# Patient Record
Sex: Male | Born: 1975 | Race: Black or African American | Hispanic: No | Marital: Single | Smoking: Never smoker
Health system: Southern US, Community
[De-identification: ages and names within clinical notes are randomized; demographics above are authoritative.]

## PROBLEM LIST (undated history)

## (undated) DIAGNOSIS — I10 Essential (primary) hypertension: Secondary | ICD-10-CM

## (undated) HISTORY — DX: Essential (primary) hypertension: I10

---

## 2008-08-17 ENCOUNTER — Emergency Department (HOSPITAL_COMMUNITY): Admission: EM | Admit: 2008-08-17 | Discharge: 2008-08-17 | Payer: Self-pay | Admitting: Emergency Medicine

## 2008-12-09 ENCOUNTER — Emergency Department (HOSPITAL_COMMUNITY): Admission: EM | Admit: 2008-12-09 | Discharge: 2008-12-09 | Payer: Self-pay | Admitting: Emergency Medicine

## 2011-01-14 LAB — BASIC METABOLIC PANEL
BUN: 9 mg/dL (ref 6–23)
CO2: 25 mEq/L (ref 19–32)
Calcium: 9.2 mg/dL (ref 8.4–10.5)
Creatinine, Ser: 1.15 mg/dL (ref 0.4–1.5)
Glucose, Bld: 95 mg/dL (ref 70–99)

## 2011-01-14 LAB — DIFFERENTIAL
Basophils Absolute: 0 10*3/uL (ref 0.0–0.1)
Eosinophils Absolute: 0.2 10*3/uL (ref 0.0–0.7)
Eosinophils Relative: 3 % (ref 0–5)
Lymphocytes Relative: 31 % (ref 12–46)
Monocytes Absolute: 0.5 10*3/uL (ref 0.1–1.0)

## 2011-01-14 LAB — CBC
Hemoglobin: 15.6 g/dL (ref 13.0–17.0)
MCHC: 34 g/dL (ref 30.0–36.0)
Platelets: 221 10*3/uL (ref 150–400)
RDW: 13.2 % (ref 11.5–15.5)

## 2011-01-14 LAB — CK TOTAL AND CKMB (NOT AT ARMC): CK, MB: 4.7 ng/mL — ABNORMAL HIGH (ref 0.3–4.0)

## 2011-01-14 LAB — POCT CARDIAC MARKERS
CKMB, poc: 3.5 ng/mL (ref 1.0–8.0)
Myoglobin, poc: 127 ng/mL (ref 12–200)

## 2011-02-16 NOTE — Consult Note (Signed)
NAMEKORE, MADLOCK NO.:  [id number]   MEDICAL RECORD NO.:  [id number]          PATIENT TYPE:  EMS   LOCATION:  [facility]                         FACILITY:  [facility]   PHYSICIAN:  Georga Hacking, M.D.DATE OF BIRTH:  Jul 24, 1976   DATE OF CONSULTATION:  12/09/2008  DATE OF DISCHARGE:                                 CONSULTATION   I was asked to see this patient by the emergency room physician for  evaluation of an abnormal EKG and atypical chest pain.  The patient has  had an upper respiratory infection for several weeks and has noted some  shortness of breath.  He turned over in bed last night and had some left  arm pain and right arm pain as well as some midupper back pain that was  vague.  His girlfriend is a nurse told to me all to be checked and he  went to the [facility] and was then sent to the emergency  room because of the complaints of chest pain.  An EKG initially done on  him was normal.  However, a repeat EKG done in the [facility] Emergency  Room showed right axis deviation, inferolateral T-wave inversions.  The  ER doctor started him on heparin and asked me to see the patient.  The  patient is not actively having midsternal chest pain.  There is no  recent history of untreated high blood pressure, cocaine use.  He does  admit occasionally smoking cigarettes.  He has not had any exertional  chest pressure, tightness suggestive of angina.   PHYSICAL EXAMINATION:  GENERAL:  He is a slightly obese male who is  currently in no acute distress.  VITAL SIGNS:  Blood pressure was 130/80, pulse was 88.  SKIN:  Warm and dry.  LUNGS:  Clear.  CARDIOVASCULAR:  Normal S1 and S2.  No S3.  ABDOMEN:  Soft and nontender.  EXTREMITIES:  Distal pulses present are 2+.   I repeated the EKG on him and it is unchanged from the previous one.   IMPRESSION:  1. Atypical chest pain, doubt ischemic chest pain.  2. Shortness of breath, upper respiratory  symptoms.  The only thing      that I neglected to mention of the upper history is the fact that      he took a prolonged airplane rod several days ago.  I feel at this      point that he probably needs to have an evaluation to rule out      exclude pulmonary embolus.   Our recommendations at this point would be to check serial cardiac  enzymes, a D-dimer, a BNP level, a chest x-ray and I would be happy to  see him if anything surfaces aside of the above.   My impression is that the EKG done in the interval probably represented  placement of the leads in the wrong place as the subsequent EKG was  unchanged.      Georga Hacking, M.D.  Electronically Signed     WST/MEDQ  D:  12/09/2008  T:  12/10/2008  Job:  [id number]   cc:   Emergency room physician

## 2011-10-26 ENCOUNTER — Emergency Department (HOSPITAL_COMMUNITY)
Admission: EM | Admit: 2011-10-26 | Discharge: 2011-10-26 | Disposition: A | Discharge status: Home / Self Care | Payer: Self-pay | Attending: Emergency Medicine | Admitting: Emergency Medicine

## 2011-10-26 ENCOUNTER — Emergency Department (HOSPITAL_COMMUNITY): Payer: Self-pay

## 2011-10-26 ENCOUNTER — Encounter (HOSPITAL_COMMUNITY): Payer: Self-pay | Admitting: General Practice

## 2011-10-26 DIAGNOSIS — S42293A Other displaced fracture of upper end of unspecified humerus, initial encounter for closed fracture: Secondary | ICD-10-CM

## 2011-10-26 DIAGNOSIS — W1809XA Striking against other object with subsequent fall, initial encounter: Secondary | ICD-10-CM | POA: Insufficient documentation

## 2011-10-26 DIAGNOSIS — S43016A Anterior dislocation of unspecified humerus, initial encounter: Secondary | ICD-10-CM | POA: Insufficient documentation

## 2011-10-26 DIAGNOSIS — Y998 Other external cause status: Secondary | ICD-10-CM | POA: Insufficient documentation

## 2011-10-26 DIAGNOSIS — Y92009 Unspecified place in unspecified non-institutional (private) residence as the place of occurrence of the external cause: Secondary | ICD-10-CM | POA: Insufficient documentation

## 2011-10-26 DIAGNOSIS — F17200 Nicotine dependence, unspecified, uncomplicated: Secondary | ICD-10-CM | POA: Insufficient documentation

## 2011-10-26 MED ORDER — SODIUM CHLORIDE 0.9 % IV SOLN
INTRAVENOUS | Status: AC | PRN
  Administered 2011-10-26: 50 mL/h via INTRAVENOUS

## 2011-10-26 MED ORDER — PROPOFOL 10 MG/ML IV BOLUS
INTRAVENOUS | Status: AC | PRN
  Administered 2011-10-26: 206 mg via INTRAVENOUS

## 2011-10-26 MED ORDER — PROPOFOL 10 MG/ML IV BOLUS
INTRAVENOUS | Status: AC | PRN
  Administered 2011-10-26: 136 mg via INTRAVENOUS

## 2011-10-26 MED ORDER — OXYCODONE-ACETAMINOPHEN 5-325 MG PO TABS
1.0000 | ORAL_TABLET | Freq: Four times a day (QID) | ORAL | Status: AC | PRN
Start: 2011-10-26 — End: 2011-11-05

## 2011-10-26 MED ORDER — PROPOFOL 10 MG/ML IV EMUL
INTRAVENOUS | Status: AC
  Filled 2011-10-26: qty 40

## 2011-10-26 MED ORDER — MORPHINE SULFATE 4 MG/ML IJ SOLN
4.0000 mg | Freq: Once | INTRAMUSCULAR | Status: AC
  Administered 2011-10-26: 4 mg via INTRAVENOUS
  Filled 2011-10-26: qty 1

## 2011-10-26 MED ORDER — PROPOFOL 10 MG/ML IV EMUL
INTRAVENOUS | Status: AC
  Filled 2011-10-26: qty 20

## 2011-10-26 NOTE — ED Notes (Signed)
Pt sts no pain after second reduction. Awaiting post reduction films.

## 2011-10-26 NOTE — ED Provider Notes (Signed)
History     CSN: [id number]  Arrival date & time 10/26/11  0865   First MD Initiated Contact with Patient 10/26/11 0805      Chief Complaint  Patient presents with  . Fall  . Shoulder Pain    (Consider location/radiation/quality/duration/timing/severity/associated sxs/prior treatment) HPI Comments: Patient reports that just prior to arrival he tripped over a pair of shoes and his left shoulder hit the sink.  He is now having pain in his left shoulder and has noticed a deformity.  No prior shoulder dislocation or injury.  He did not hit his head with the injury.  No LOC.  Patient is a 36 y.o. male presenting with fall and shoulder pain. The history is provided by the patient.  Fall Pertinent negatives include no fever, no nausea, no vomiting, no headaches, no loss of consciousness and no tingling. He has tried nothing for the symptoms.  Shoulder Pain Pertinent negatives include no chest pain, chills, diaphoresis, fever, headaches, nausea or vomiting.    History reviewed. No pertinent past medical history.  History reviewed. No pertinent past surgical history.  No family history on file.  History  Substance Use Topics  . Smoking status: Current Everyday Smoker  . Smokeless tobacco: Not on file  . Alcohol Use: Yes     Socially      Review of Systems  Constitutional: Negative for fever, chills and diaphoresis.  Respiratory: Negative for shortness of breath.   Cardiovascular: Negative for chest pain.  Gastrointestinal: Negative for nausea and vomiting.  Skin: Negative for color change.  Neurological: Negative for dizziness, tingling, loss of consciousness, syncope and headaches.    Allergies  Review of patient's allergies indicates not on file.  Home Medications  No current outpatient prescriptions on file.  BP 144/82  Pulse 66  Temp(Src) 97.7 F (36.5 C) (Oral)  Resp 16  SpO2 99%  Physical Exam  Nursing note and vitals reviewed. Constitutional: He is  oriented to person, place, and time. He appears well-developed and well-nourished. No distress.  HENT:  Head: Normocephalic and atraumatic.  Neck: Normal range of motion. Neck supple.  Cardiovascular: Normal rate and normal heart sounds.   Pulmonary/Chest: Effort normal and breath sounds normal. No respiratory distress. He has no wheezes.  Musculoskeletal:       Left shoulder: He exhibits decreased range of motion and pain. He exhibits no swelling, no effusion, no laceration and normal pulse.       Positive sulcus sign of left shoulder. Left radial pulse 2+.  Distal sensation of the left hand intact.   Sensation of shoulder and humerus intact.  Neurological: He is alert and oriented to person, place, and time.  Skin: Skin is warm and dry. He is not diaphoretic. No erythema.  Psychiatric: He has a normal mood and affect.    ED Course  Reduction of dislocation Performed by: Anne Shutter, Chamia Schmutz Authorized by: Anne Shutter, Herbert Seta Consent: Verbal consent obtained. Written consent obtained. Risks and benefits: risks, benefits and alternatives were discussed Consent given by: patient Patient understanding: patient states understanding of the procedure being performed Patient consent: the patient's understanding of the procedure matches consent given Procedure consent: procedure consent matches procedure scheduled Relevant documents: relevant documents present and verified Test results: test results available and properly labeled Imaging studies: imaging studies available Patient identity confirmed: verbally with patient and arm band Time out: Immediately prior to procedure a "time out" was called to verify the correct patient, procedure, equipment, support staff and site/side  marked as required. Patient sedated: yes Sedation type: moderate (conscious) sedation Sedatives: propofol Vitals: Vital signs were monitored during sedation. Patient tolerance: Patient tolerated the procedure well  with no immediate complications.   (including critical care time)  Labs Reviewed - No data to display Dg Shoulder Left  10/26/2011  *RADIOLOGY REPORT*  Clinical Data: Fall.  Shoulder pain.  LEFT SHOULDER - 2+ VIEW  Comparison: None.  Findings: The left humerus is dislocated anteriorly and inferiorly with respect to the glenoid.  A discrete fracture is not readily apparent.  IMPRESSION:  1.  Anterior shoulder dislocation.  Original Report Authenticated By: Dellia Cloud, M.D.     No diagnosis found.  Shoulder dislocation was reduced after first reduction.  However, patient was moving his shoulder a lot prior to immobilizer application.  An xray showed that the shoulder was still dislocated.  10:45 AM  Shoulder dislocation reduction repeated.  Shoulder immobilizer immediately applied.  Another xray has been ordered to ensure shoulder is no longer dislocated.  Xray shows that the xray shoulder dislocation has been reduced, but xray did show Hill Sachs impaction of the humeral head and probably bony Bankart injury.  Patient neurovascularly intact.  Patient awake and alert.  Results of the Xray discussed with patient.  Patient demonstrates understanding.  Patient instructed to follow up with Orthopedics.     MDM  Patient with a mechanical fall resulting in an anterior shoulder dislocation.  Shoulder dislocation reduction done in the ED under sedation.  [facility] impaction of the radial head and probably bony Bankart injury were shown on repeat xray.  Patient instructed to follow up with Orthopedics, wear the immobilizer, and discharged home with pain medication.        Pascal Lux Tyler, PA-C 10/26/11 1600

## 2011-10-26 NOTE — ED Notes (Signed)
Shoulder found to be out of place a second time. Moderate sedation set up for second reduction. Hyman Hopes, MD, Herbert Seta, Georgia and 2 RNs at bedside.

## 2011-10-26 NOTE — ED Notes (Signed)
Pt refuses to go to Xray due to pain. Sts he wants to wait until pain in shoulder is gone. Pt informed pain will not be completely relieved with pain medicine, only decreased and to not expect complete pain relief. Pt continues to refuse xray. Will continue to monitor.

## 2011-10-26 NOTE — ED Notes (Signed)
Patient states he was in bathroom and fell backwards. Patient denies LOC. C/o left shoulder pain and decreased mobility. Arrived with ice pack in place, denies N/V.

## 2011-10-26 NOTE — ED Notes (Signed)
Patient transported to X-ray 

## 2011-10-28 NOTE — ED Provider Notes (Signed)
Medical screening examination/treatment/procedure(s) were conducted as a shared visit with non-physician practitioner(s) and myself.  I personally evaluated the patient during the encounter  S/p mechanical fall. Lt shoulder pain and flattening. According to nursing and Ortho tech patient very actively moving LUE prior to immobilizer being placed although it was immediately available after reduction. Post reduction XR revealed repeat dislocation. Pt underwent second reduction/CS at that time. Neurovascularly intact but 2nd procedure complicated by fx. Patient aware. To f/u with orthopedic surgery.  Forbes Cellar, MD 10/28/11 1317

## 2013-07-04 IMAGING — CR DG SHOULDER 2+V*L*
2 series · 2 of 2 positions shown · non-contrast
Comparison: Earlier today at [DATE]

CLINICAL DATA: Status post dislocation reduction

LEFT SHOULDER - 2+ VIEW

[w shoulder internal left]
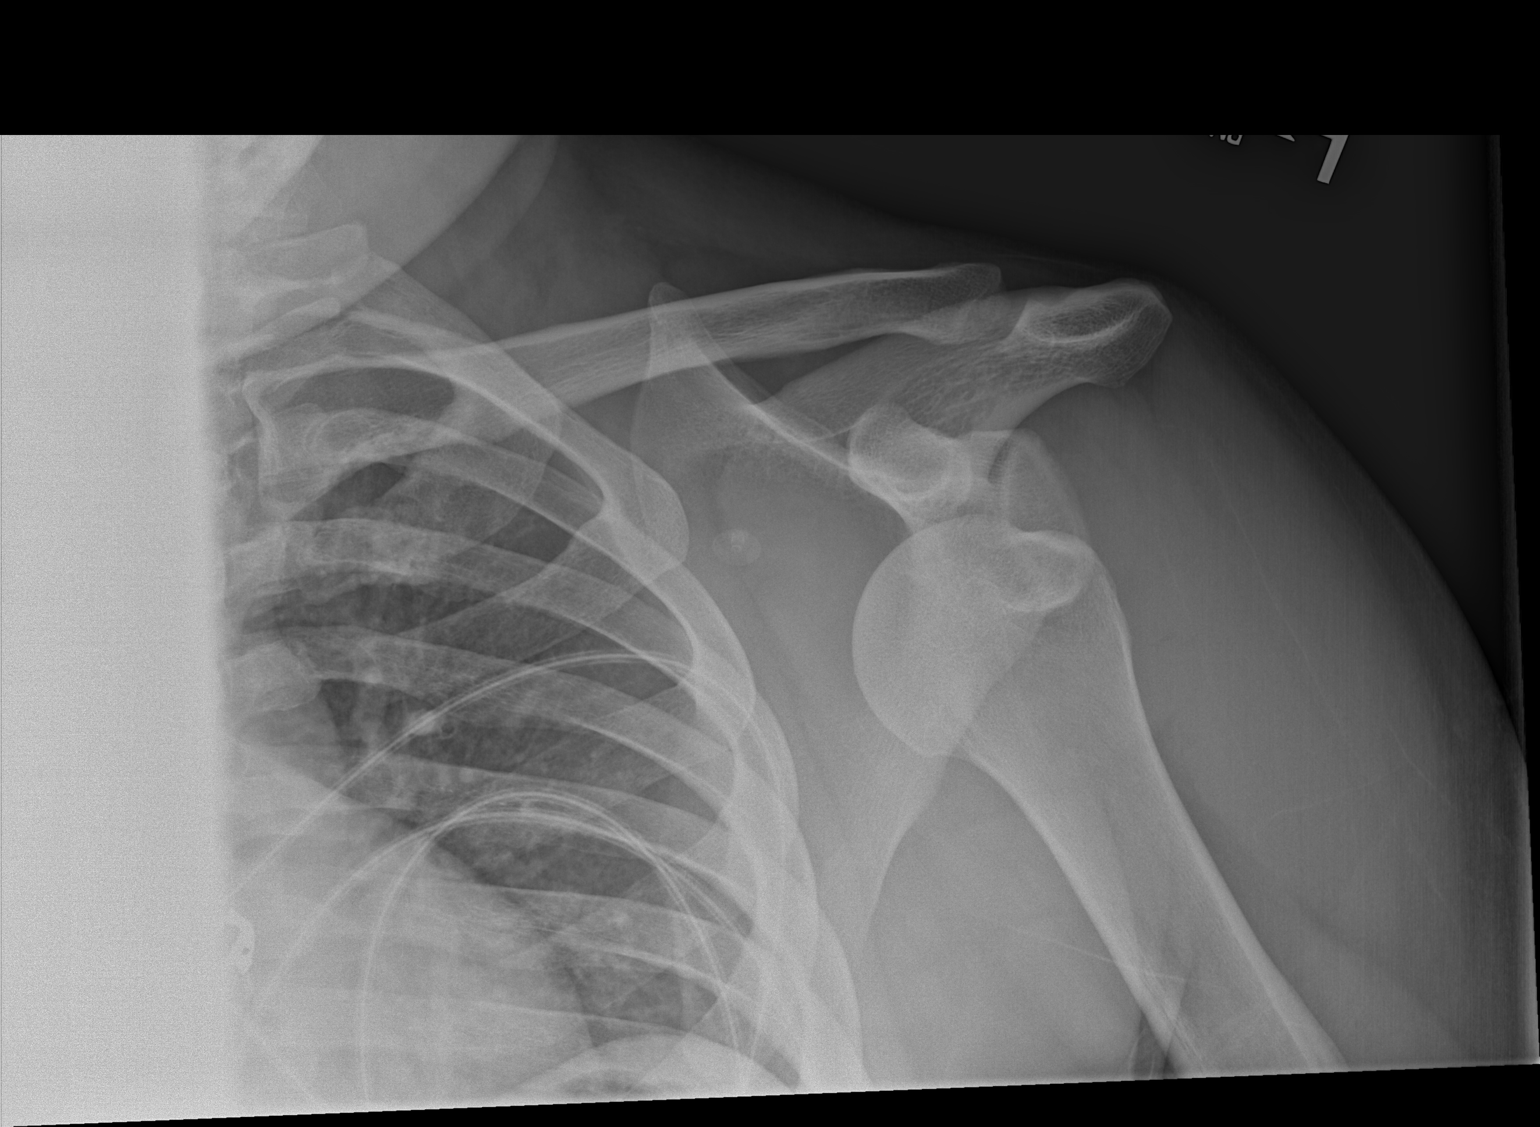

[w shoulder external left]
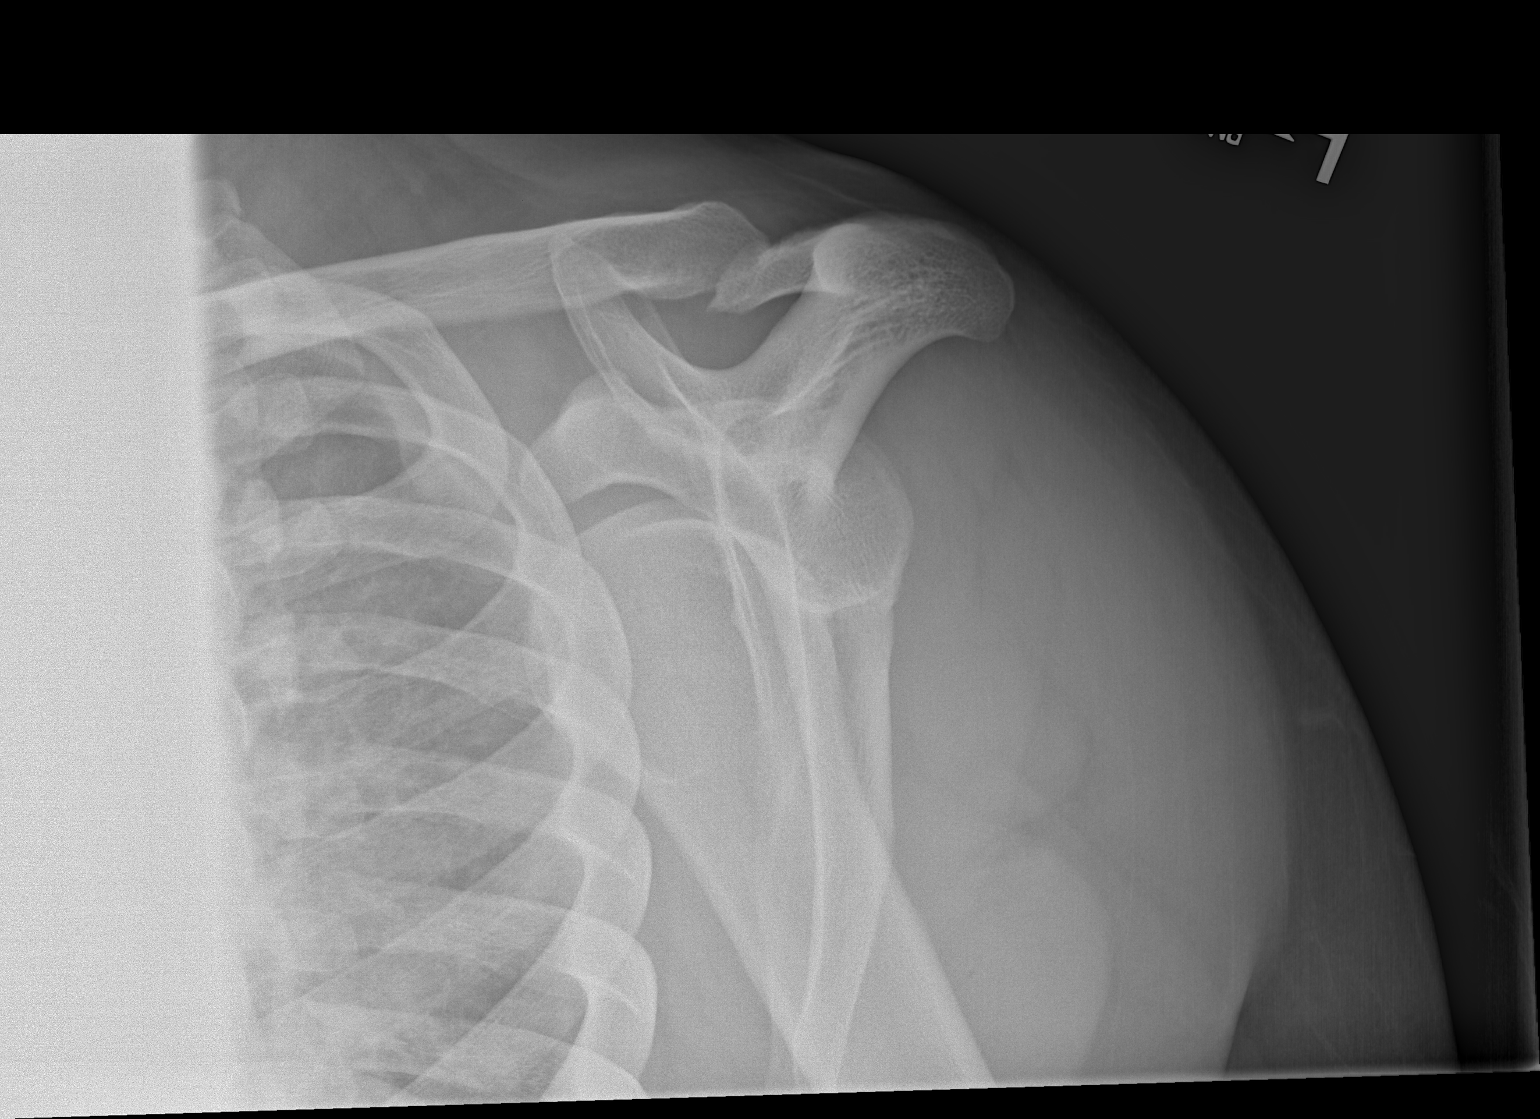

[2 of 2 positions shown; findings below may reference images not displayed]

FINDINGS: The left humeral head remains anteriorly and inferiorly
dislocated from the glenoid fossa.  No definite associated fracture
is identified.  The AC joint is intact.
IMPRESSION: Persistent anterior inferior dislocation of the left humeral head
from the glenoid fossa.

## 2016-03-13 ENCOUNTER — Encounter (HOSPITAL_COMMUNITY): Payer: Self-pay | Admitting: Emergency Medicine

## 2016-03-13 ENCOUNTER — Emergency Department (HOSPITAL_COMMUNITY)
Admission: EM | Admit: 2016-03-13 | Discharge: 2016-03-13 | Disposition: A | Discharge status: Home / Self Care | Payer: Self-pay | Attending: Emergency Medicine | Admitting: Emergency Medicine

## 2016-03-13 DIAGNOSIS — W540XXA Bitten by dog, initial encounter: Secondary | ICD-10-CM | POA: Insufficient documentation

## 2016-03-13 DIAGNOSIS — S81852A Open bite, left lower leg, initial encounter: Secondary | ICD-10-CM | POA: Insufficient documentation

## 2016-03-13 DIAGNOSIS — F17200 Nicotine dependence, unspecified, uncomplicated: Secondary | ICD-10-CM | POA: Insufficient documentation

## 2016-03-13 DIAGNOSIS — Y9301 Activity, walking, marching and hiking: Secondary | ICD-10-CM | POA: Insufficient documentation

## 2016-03-13 DIAGNOSIS — Z79899 Other long term (current) drug therapy: Secondary | ICD-10-CM | POA: Insufficient documentation

## 2016-03-13 DIAGNOSIS — Y999 Unspecified external cause status: Secondary | ICD-10-CM | POA: Insufficient documentation

## 2016-03-13 DIAGNOSIS — Y92410 Unspecified street and highway as the place of occurrence of the external cause: Secondary | ICD-10-CM | POA: Insufficient documentation

## 2016-03-13 MED ORDER — NAPROXEN 500 MG PO TABS: 500.0000 mg | ORAL_TABLET | Freq: Two times a day (BID) | ORAL | Status: DC

## 2016-03-13 MED ORDER — AMOXICILLIN-POT CLAVULANATE 875-125 MG PO TABS
1.0000 | ORAL_TABLET | Freq: Once | ORAL | Status: AC
  Administered 2016-03-13: 1 via ORAL
  Filled 2016-03-13: qty 1

## 2016-03-13 MED ORDER — RABIES IMMUNE GLOBULIN 150 UNIT/ML IM INJ
20.0000 [IU]/kg | INJECTION | Freq: Once | INTRAMUSCULAR | Status: AC
  Administered 2016-03-13: 2625 [IU] via INTRAMUSCULAR
  Filled 2016-03-13: qty 17.5

## 2016-03-13 MED ORDER — RABIES VACCINE, PCEC IM SUSR
1.0000 mL | Freq: Once | INTRAMUSCULAR | Status: AC
  Administered 2016-03-13: 1 mL via INTRAMUSCULAR
  Filled 2016-03-13: qty 1

## 2016-03-13 MED ORDER — TETANUS-DIPHTH-ACELL PERTUSSIS 5-2.5-18.5 LF-MCG/0.5 IM SUSP
0.5000 mL | Freq: Once | INTRAMUSCULAR | Status: AC
  Administered 2016-03-13: 0.5 mL via INTRAMUSCULAR
  Filled 2016-03-13: qty 0.5

## 2016-03-13 MED ORDER — RABIES IMMUNE GLOBULIN 150 UNIT/ML IM INJ
20.0000 [IU]/kg | INJECTION | Freq: Once | INTRAMUSCULAR | Status: DC
  Filled 2016-03-13: qty 18

## 2016-03-13 MED ORDER — AMOXICILLIN-POT CLAVULANATE 875-125 MG PO TABS: 1.0000 | ORAL_TABLET | Freq: Two times a day (BID) | ORAL | Status: DC

## 2016-03-13 NOTE — ED Notes (Signed)
Shawn, PA at bedside. 

## 2016-03-13 NOTE — Discharge Instructions (Signed)
You have been seen today for evaluation of a dog bite.   1. Antibiotics: Dog bites have a high likelihood of infection, sometimes even when antibiotics are taken correctly.  Please take all of your antibiotics until finished!   You may develop abdominal discomfort or diarrhea from the antibiotic.  You may help offset this with probiotics which you can buy or get in yogurt. Do not eat or take the probiotics until 2 hours after your antibiotic.  2. Rabies treatment: You have been given the first dose of the rabies series here in the ED. Today counts as day 1 of the series. Subsequent doses are to be given on day 3 (June 12), 7 (June 16), and 14 (June 23). These [facility] be given at the [facility]. 3. Wound check: Follow up with PCP for a wound check in 3 days.  4. Keep the wound clean and dry. May use neosporin. 5. Return to ED should symptoms worsen or you begin to have increased redness, heat, or pus draining from the wound. Also return should you begin to have fever/chills, nausea/vomiting, abdominal pain, or any other major concerns.

## 2016-03-13 NOTE — ED Notes (Signed)
Pt states he was bit by his neighbors dog yesterday. Says the owner said the dog hasn't been vaccinated recently. Minor puncture wound to back of right thigh that is scabbed over. States he came here for a tetanus shot

## 2016-03-13 NOTE — ED Provider Notes (Signed)
CSN: [id number]     Arrival date & time 03/13/16  0740 History   First MD Initiated Contact with Patient 03/13/16 [phone]     Chief Complaint  Patient presents with  . Animal Bite     (Consider location/radiation/quality/duration/timing/severity/associated sxs/prior Treatment) HPI   Jason Ramos is a 40 y.o. male, Patient with no pertinent past medical history, presenting to the ED with a dog bite that occurred yesterday morning around 1030 AM. Pt states he was walking down the street when a large dog, looked to be a rotweiller mix, came out of the bushes and bit his left lower leg. Pt has a wound to the left posterior lower leg. No active hemorrhage. Tetanus not up to date. The dog's owner refused to provide the patient with vaccine or vet information for the dog. Pt adds he is not sure that the dog's owner lives in the apartment complex where the bite occurred. Unknown if the dog will be able to be found. Pt denies neuro deficits, fever/chills, N/V, other bites or injuries, or any other complaints.      History reviewed. No pertinent past medical history. History reviewed. No pertinent past surgical history. History reviewed. No pertinent family history. Social History  Substance Use Topics  . Smoking status: Current Every Day Smoker  . Smokeless tobacco: None  . Alcohol Use: Yes     Comment: Socially    Review of Systems  Constitutional: Negative for fever and chills.  Gastrointestinal: Negative for nausea and vomiting.  Musculoskeletal: Negative for joint swelling and arthralgias.  Skin: Positive for wound.  Neurological: Negative for dizziness, weakness, light-headedness and numbness.  All other systems reviewed and are negative.     Allergies  Review of patient's allergies indicates no known allergies.  Home Medications   Prior to Admission medications   Medication Sig Start Date End Date Taking? Authorizing Provider  naproxen sodium (ANAPROX) 220 MG tablet Take 440  mg by mouth 2 (two) times daily as needed. For pain   Yes Historical Provider, MD  amoxicillin-clavulanate (AUGMENTIN) 875-125 MG tablet Take 1 tablet by mouth every 12 (twelve) hours. 03/13/16   Shawn C Joy, PA-C  naproxen (NAPROSYN) 500 MG tablet Take 1 tablet (500 mg total) by mouth 2 (two) times daily. 03/13/16   Shawn C Joy, PA-C   BP 176/105 mmHg  Pulse 88  Temp(Src) 98 F (36.7 C) (Oral)  Resp 17  SpO2 100% Physical Exam  Constitutional: He appears well-developed and well-nourished. No distress.  HENT:  Head: Normocephalic and atraumatic.  Eyes: Conjunctivae are normal.  Neck: Neck supple.  Cardiovascular: Normal rate, regular rhythm and intact distal pulses.   Pulmonary/Chest: Effort normal. No respiratory distress.  Abdominal: There is no guarding.  Musculoskeletal: He exhibits tenderness. He exhibits no edema.  Full ROM in both lower extremities. No bony tenderness, crepitus, or deformity.   Lymphadenopathy:    He has no cervical adenopathy.  Neurological: He is alert.  No sensory deficits. Strength 5/5.   Skin: Skin is warm and dry. He is not diaphoretic.  Small, superficial-appearing wound to the posterior left lower leg. No bleeding, swelling, discharge, or surrounding erythema.   Psychiatric: He has a normal mood and affect. His behavior is normal.  Nursing note and vitals reviewed.   ED Course  Procedures (including critical care time)   MDM   Final diagnoses:  Dog bite    Jason Ramos presents following a dog bite that occurred yesterday.  No indication  for imaging at this time. Wound irrigated. Healing process is already started. In house [employer] officer utilized for reference. Officer gave [facility] the [facility] number for patient to call and inquire about the dog's rabies vaccination status and report the bite. Due to the unknown vaccination status of the dog, the lack of complete information, and a question of whether the dog could be  found again, patient treated using rabies treatment protocol. Follow-up injections were discussed along with return precautions. Augmentin on discharge. Patient voiced understanding of these instructions, agrees to the plan, and is comfortable with discharge.  Filed Vitals:   03/13/16 0743 03/13/16 0908 03/13/16 1012  BP: 176/105  175/120  Pulse: 88  82  Temp: 98 F (36.7 C)    TempSrc: Oral    Resp: 17  16  Weight:  131.543 kg   SpO2: 100%  100%     Anselm PancoastShawn C Joy, PA-C 03/13/16 1543  Linwood DibblesJon Knapp, MD 03/13/16 1626

## 2016-03-15 ENCOUNTER — Encounter (HOSPITAL_COMMUNITY): Payer: Self-pay | Admitting: Emergency Medicine

## 2016-03-15 ENCOUNTER — Ambulatory Visit (HOSPITAL_COMMUNITY)
Admission: EM | Admit: 2016-03-15 | Discharge: 2016-03-15 | Disposition: A | Discharge status: Home / Self Care | Payer: Self-pay | Attending: Physician Assistant | Admitting: Physician Assistant

## 2016-03-15 DIAGNOSIS — Z203 Contact with and (suspected) exposure to rabies: Secondary | ICD-10-CM

## 2016-03-15 MED ORDER — RABIES VACCINE, PCEC IM SUSR
INTRAMUSCULAR | Status: AC
  Filled 2016-03-15: qty 1

## 2016-03-15 MED ORDER — RABIES VACCINE, PCEC IM SUSR
1.0000 mL | Freq: Once | INTRAMUSCULAR | Status: AC
  Administered 2016-03-15: 1 mL via INTRAMUSCULAR

## 2016-03-15 NOTE — ED Notes (Signed)
Patient was bitten by a dog on 6/9.  Patient went to emergency department on 6/10 .  6/10 is when series was initiated.  Patient is at [facility] for day #3 in rabies series.

## 2016-03-15 NOTE — Discharge Instructions (Signed)
Return on 6/16 for next rabies injection in the series.

## 2016-03-19 ENCOUNTER — Ambulatory Visit (HOSPITAL_COMMUNITY)
Admission: EM | Admit: 2016-03-19 | Discharge: 2016-03-19 | Disposition: A | Discharge status: Home / Self Care | Payer: Self-pay | Attending: Family Medicine | Admitting: Family Medicine

## 2016-03-19 ENCOUNTER — Encounter (HOSPITAL_COMMUNITY): Payer: Self-pay | Admitting: *Deleted

## 2016-03-19 DIAGNOSIS — Z203 Contact with and (suspected) exposure to rabies: Secondary | ICD-10-CM

## 2016-03-19 MED ORDER — RABIES VACCINE, PCEC IM SUSR
1.0000 mL | Freq: Once | INTRAMUSCULAR | Status: AC
  Administered 2016-03-19: 1 mL via INTRAMUSCULAR

## 2016-03-19 MED ORDER — RABIES VACCINE, PCEC IM SUSR
INTRAMUSCULAR | Status: AC
  Filled 2016-03-19: qty 1

## 2016-03-19 NOTE — ED Notes (Signed)
Pt  Here  For  Next  In  Series of  Rabies injections        Pt  Verbalizes  No  Complaints  At this  Time      He  shhould  recceive   His  Next  Shot in  1  Week to  Complete the  Series

## 2016-03-19 NOTE — Discharge Instructions (Signed)
Return in  1  Week  To  Complete  Your  Series       Return  Sooner   If  Any  Problems

## 2016-03-26 ENCOUNTER — Ambulatory Visit (HOSPITAL_COMMUNITY)
Admission: EM | Admit: 2016-03-26 | Discharge: 2016-03-26 | Disposition: A | Discharge status: Home / Self Care | Payer: Self-pay | Attending: Emergency Medicine | Admitting: Emergency Medicine

## 2016-03-26 ENCOUNTER — Encounter (HOSPITAL_COMMUNITY): Payer: Self-pay | Admitting: Emergency Medicine

## 2016-03-26 DIAGNOSIS — Z203 Contact with and (suspected) exposure to rabies: Secondary | ICD-10-CM

## 2016-03-26 MED ORDER — RABIES VACCINE, PCEC IM SUSR
INTRAMUSCULAR | Status: AC
  Filled 2016-03-26: qty 1

## 2016-03-26 MED ORDER — RABIES VACCINE, PCEC IM SUSR
1.0000 mL | Freq: Once | INTRAMUSCULAR | Status: AC
  Administered 2016-03-26: 1 mL via INTRAMUSCULAR

## 2016-03-26 NOTE — Discharge Instructions (Signed)
This was your final injection in the rabies series. [facility].

## 2016-03-26 NOTE — ED Notes (Signed)
The patient presented to the [facility] for the final injection in the rabies series. 

## 2016-04-21 ENCOUNTER — Encounter (HOSPITAL_COMMUNITY): Payer: Self-pay | Admitting: Emergency Medicine

## 2016-04-21 ENCOUNTER — Ambulatory Visit (HOSPITAL_COMMUNITY)
Admission: EM | Admit: 2016-04-21 | Discharge: 2016-04-21 | Disposition: A | Discharge status: Home / Self Care | Payer: Self-pay | Attending: Emergency Medicine | Admitting: Emergency Medicine

## 2016-04-21 DIAGNOSIS — L0390 Cellulitis, unspecified: Secondary | ICD-10-CM

## 2016-04-21 DIAGNOSIS — L0291 Cutaneous abscess, unspecified: Secondary | ICD-10-CM

## 2016-04-21 MED ORDER — CEPHALEXIN 500 MG PO CAPS: 500.0000 mg | ORAL_CAPSULE | Freq: Four times a day (QID) | ORAL | Status: DC

## 2016-04-21 MED ORDER — HYDROCODONE-ACETAMINOPHEN 5-325 MG PO TABS: 1.0000 | ORAL_TABLET | ORAL | Status: DC | PRN

## 2016-04-21 NOTE — Discharge Instructions (Signed)
I'm concerned that you have a deeper infection in your hand. It is okay to soak your hand in warm soapy water. Start the Keflex as soon as possible. Take this 4 times a day. You can take hydrocodone every 4-6 hours as needed for pain. Do not drive while taking this medicine. Please call Dr. Debby Budoley's office first thing in the morning.  They should give you an appointment tomorrow afternoon. I suspect he is going to need to open it up to drain it.

## 2016-04-21 NOTE — ED Notes (Signed)
[facility] with a complaint of a chemical burn to his right hand that occurred 5 days ago that he now believes to be infected.

## 2016-04-21 NOTE — ED Provider Notes (Signed)
CSN: [id number]     Arrival date & time 04/21/16  1558 History   First MD Initiated Contact with Patient 04/21/16 1702     Chief Complaint  Patient presents with  . Hand Burn   (Consider location/radiation/quality/duration/timing/severity/associated sxs/prior Treatment) HPI He is a 40 year old man here for evaluation of right hand issue. He was helping a friend change his car battery 5 days ago and got some battery acid on his hand. It is unclear if this went through his glove or down the glove. Since that time he has had slowly worsening redness, swelling, and pain. Pain is worse on the palmar aspect. He has noticed several pus pockets. He did pop one of them with a safety pin. He denies any streaking. No fevers or chills. He states it is quite painful. It is difficult for him to move his pinky finger due to swelling.  History reviewed. No pertinent past medical history. History reviewed. No pertinent past surgical history. History reviewed. No pertinent family history. Social History  Substance Use Topics  . Smoking status: Current Every Day Smoker  . Smokeless tobacco: None  . Alcohol Use: Yes     Comment: Socially    Review of Systems As in history of present illness Allergies  Review of patient's allergies indicates no known allergies.  Home Medications   Prior to Admission medications   Medication Sig Start Date End Date Taking? Authorizing Provider  cephALEXin (KEFLEX) 500 MG capsule Take 1 capsule (500 mg total) by mouth 4 (four) times daily. 04/21/16   Charm Rings, MD  HYDROcodone-acetaminophen (NORCO) 5-325 MG tablet Take 1 tablet by mouth every 4 (four) hours as needed for moderate pain or severe pain. 04/21/16   Charm Rings, MD  naproxen sodium (ANAPROX) 220 MG tablet Take 440 mg by mouth 2 (two) times daily as needed. For pain    Historical Provider, MD   Meds Ordered and Administered this Visit  Medications - No data to display  BP 166/101 mmHg  Pulse 67   Temp(Src) 98.3 F (36.8 C) (Oral)  Resp 18  Ht  (1.93 m)  Wt 280 lb (127.007 kg)  BMI 34.10 kg/m2  SpO2 100% No data found.   Physical Exam  Constitutional: He is oriented to person, place, and time. He appears well-developed and well-nourished. No distress.  Cardiovascular: Normal rate.   Pulmonary/Chest: Effort normal.  Musculoskeletal:  Right hand: He has significant swelling and erythema of the dorsal hand on the ulnar side. This extends about halfway across the hand. On the palmar hand he has some dryness and scaling over the ulnar hand and fourth metacarpal area. There is significant swelling, particularly of the proximal fifth digit and inter-webspace. There does appear to be some purulent drainage from a small hole in the webspace. This entire area is quite tender. He is able to extend his finger, but it is difficult to flex and extend it due to swelling and pain. No streaking.  Neurological: He is alert and oriented to person, place, and time.    ED Course  Procedures (including critical care time)  Labs Review Labs Reviewed - No data to display  Imaging Review No results found.   MDM   1. Cellulitis and abscess    Wound does not appear consistent with a burn. However, he clearly has infection with likely abscess.  I called and spoke with Dr. Izora Ribas, hand surgeon on call. Given lack of systemic symptoms and streaking, will place on  Keflex and he will follow-up in his office tomorrow afternoon. I suspect this will need a more extensive I&D and I can perform at the urgent care.  Prescription for Norco given for pain.    Charm RingsErin J Honig, MD 04/21/16 [phone]

## 2019-02-05 ENCOUNTER — Other Ambulatory Visit: Payer: Self-pay

## 2019-02-05 ENCOUNTER — Encounter: Payer: Self-pay | Admitting: Registered Nurse

## 2019-02-05 ENCOUNTER — Ambulatory Visit (INDEPENDENT_AMBULATORY_CARE_PROVIDER_SITE_OTHER): Payer: 59 | Admitting: Registered Nurse

## 2019-02-05 VITALS — BP 148/93 | HR 81 | Temp 98.0°F | Resp 16 | Ht 75.59 in | Wt 273.0 lb

## 2019-02-05 DIAGNOSIS — Z13220 Encounter for screening for lipoid disorders: Secondary | ICD-10-CM | POA: Diagnosis not present

## 2019-02-05 DIAGNOSIS — Z6833 Body mass index (BMI) 33.0-33.9, adult: Secondary | ICD-10-CM | POA: Diagnosis not present

## 2019-02-05 DIAGNOSIS — Z7689 Persons encountering health services in other specified circumstances: Secondary | ICD-10-CM | POA: Diagnosis not present

## 2019-02-05 DIAGNOSIS — I10 Essential (primary) hypertension: Secondary | ICD-10-CM | POA: Insufficient documentation

## 2019-02-05 DIAGNOSIS — E669 Obesity, unspecified: Secondary | ICD-10-CM | POA: Diagnosis not present

## 2019-02-05 DIAGNOSIS — K5900 Constipation, unspecified: Secondary | ICD-10-CM

## 2019-02-05 MED ORDER — PSYLLIUM 95 % PO PACK: 1.0000 | PACK | Freq: Every day | ORAL | 5 refills | Status: AC

## 2019-02-05 MED ORDER — PSYLLIUM 95 % PO PACK: 1.0000 | PACK | Freq: Every day | ORAL | 5 refills | Status: DC

## 2019-02-05 MED ORDER — AMLODIPINE BESYLATE 5 MG PO TABS: 5.0000 mg | ORAL_TABLET | Freq: Every day | ORAL | 0 refills | Status: DC

## 2019-02-05 MED ORDER — PSYLLIUM 95 % PO PACK: 1.0000 | PACK | Freq: Every day | ORAL | Status: DC

## 2019-02-05 NOTE — Progress Notes (Signed)
Acute Office Visit  Subjective:    Patient ID: Jason Ramos, male    DOB: 1976/02/11, 43 y.o.   MRN: [id number]  Chief Complaint  Patient presents with  . Elavated B/P    pt states since Sat. his B/P has been elavated, he was feeling dizzy and Fatigue   . Fatigue    HPI Patient is in today for establishing care with myself as PCP, HTN, constipation.  HTN: Pt reports feeling brief episodes of lightheadedness over the previous weekend. His friend who is a nurse measured his BP to be 155/89, 172/104, and 178/104 during those times. Today, in office, he measured 184/93. He meets the diagnostic criteria for hypertension and warrants medication.  Constipation: Pt reports he usually doesn't eat sausage, however, he did this weekend and noticed reflux. He took Pepto for this, and noticed it caused constipation. After two days of discomfort, he took magnesium citrate, which has given him 2 days of loose stools. At this time, he wants "to get back to normal".   History reviewed. No pertinent past medical history.  History reviewed. No pertinent surgical history.  Family History  Problem Relation Age of Onset  . Hypertension Mother   . Hypertension Father   . Hypertension Sister     Social History   Socioeconomic History  . Marital status: Single    Spouse name: Not on file  . Number of children: Not on file  . Years of education: Not on file  . Highest education level: Not on file  Occupational History  . Not on file  Social Needs  . Financial resource strain: Not on file  . Food insecurity:    Worry: Not on file    Inability: Not on file  . Transportation needs:    Medical: Not on file    Non-medical: Not on file  Tobacco Use  . Smoking status: Never Smoker  . Smokeless tobacco: Never Used  Substance and Sexual Activity  . Alcohol use: Yes    Comment: Socially  . Drug use: No  . Sexual activity: Yes  Lifestyle  . Physical activity:    Days per week: Not on file     Minutes per session: Not on file  . Stress: Not on file  Relationships  . Social connections:    Talks on phone: Not on file    Gets together: Not on file    Attends religious service: Not on file    Active member of club or organization: Not on file    Attends meetings of clubs or organizations: Not on file    Relationship status: Not on file  . Intimate partner violence:    Fear of current or ex partner: Not on file    Emotionally abused: Not on file    Physically abused: Not on file    Forced sexual activity: Not on file  Other Topics Concern  . Not on file  Social History Narrative  . Not on file    Outpatient Medications Prior to Visit  Medication Sig Dispense Refill  . cephALEXin (KEFLEX) 500 MG capsule Take 1 capsule (500 mg total) by mouth 4 (four) times daily. 28 capsule 0  . HYDROcodone-acetaminophen (NORCO) 5-325 MG tablet Take 1 tablet by mouth every 4 (four) hours as needed for moderate pain or severe pain. 15 tablet 0  . naproxen sodium (ANAPROX) 220 MG tablet Take 440 mg by mouth 2 (two) times daily as needed. For pain  No facility-administered medications prior to visit.     No Known Allergies  Review of Systems  Constitutional: Negative for fever and malaise/fatigue.  Eyes: Negative for blurred vision and pain.  Respiratory: Negative for cough, shortness of breath and wheezing.   Cardiovascular: Negative for chest pain.  Gastrointestinal: Positive for diarrhea and heartburn. Negative for abdominal pain, blood in stool, constipation, melena, nausea and vomiting.  Neurological: Positive for dizziness. Negative for tingling, tremors, sensory change, speech change, focal weakness, seizures, loss of consciousness, weakness and headaches.  All other systems reviewed and are negative.      Objective:    Physical Exam  Constitutional: He is oriented to person, place, and time. He appears well-developed and well-nourished. No distress.  HENT:  Head:  Normocephalic and atraumatic.  Right Ear: External ear normal.  Left Ear: External ear normal.  Eyes: Conjunctivae are normal. Right eye exhibits no discharge.  Cardiovascular: Normal rate, regular rhythm and normal heart sounds. Exam reveals no gallop and no friction rub.  No murmur heard. Pulmonary/Chest: Effort normal and breath sounds normal. No respiratory distress. He has no wheezes. He has no rales. He exhibits no tenderness.  Musculoskeletal: Normal range of motion.     Right shoulder: He exhibits normal strength.     Comments: Strength 5/5 bilat through all extremities  Neurological: He is alert and oriented to person, place, and time. He has normal reflexes. He exhibits normal muscle tone. GCS eye subscore is 4. GCS verbal subscore is 5. GCS motor subscore is 6.  Skin: Skin is warm and dry. No rash noted. He is not diaphoretic. No erythema.  Psychiatric: He has a normal mood and affect. His behavior is normal. Judgment and thought content normal.  Nursing note and vitals reviewed.   BP (!) 148/93   Pulse 81   Temp 98 F (36.7 C) (Oral)   Resp 16   Ht 6' 3.59" (1.92 m)   Wt 273 lb (123.8 kg)   SpO2 100%   BMI 33.59 kg/m  Wt Readings from Last 3 Encounters:  02/05/19 273 lb (123.8 kg)  04/21/16 280 lb (127 kg)  03/13/16 290 lb (131.5 kg)    Health Maintenance Due  Topic Date Due  . HIV Screening  04/15/1989    There are no preventive care reminders to display for this patient.   No results found for: TSH Lab Results  Component Value Date   WBC 6.6 12/09/2008   HGB 15.6 12/09/2008   HCT 46.0 12/09/2008   MCV 87.1 12/09/2008   PLT 221 12/09/2008   Lab Results  Component Value Date   NA 139 12/09/2008   K 4.1 12/09/2008   CO2 25 12/09/2008   GLUCOSE 95 12/09/2008   BUN 9 12/09/2008   CREATININE 1.15 12/09/2008   CALCIUM 9.2 12/09/2008   No results found for: CHOL No results found for: HDL No results found for: LDLCALC No results found for: TRIG No  results found for: CHOLHDL No results found for: ZOXW9U     Assessment & Plan:   Problem List Items Addressed This Visit      Cardiovascular and Mediastinum   Essential hypertension   Relevant Medications   amLODipine (NORVASC) 5 MG tablet   Other Relevant Orders   CBC with Differential   Comprehensive metabolic panel    Other Visit Diagnoses    Encounter to establish care    -  Primary   Lipid screening       Relevant Orders  Lipid Panel   Class 1 obesity without serious comorbidity with body mass index (BMI) of 33.0 to 33.9 in adult, unspecified obesity type       Relevant Orders   CBC with Differential   Comprehensive metabolic panel   Hemoglobin A1c   Constipation, unspecified constipation type       Relevant Medications   psyllium (HYDROCIL/METAMUCIL) 95 % PACK       Meds ordered this encounter  Medications  . amLODipine (NORVASC) 5 MG tablet    Sig: Take 1 tablet (5 mg total) by mouth daily.    Dispense:  120 tablet    Refill:  0    Order Specific Question:   Supervising Provider    Answer:   Collie Siad A [id number]  . DISCONTD: psyllium (HYDROCIL/METAMUCIL) packet 1 packet  . DISCONTD: psyllium (HYDROCIL/METAMUCIL) 95 % PACK    Sig: Take 1 packet by mouth daily.    Dispense:  10 packet    Refill:  5    Order Specific Question:   Supervising Provider    Answer:   Collie Siad A [id number]  . psyllium (HYDROCIL/METAMUCIL) 95 % PACK    Sig: Take 1 packet by mouth daily.    Dispense:  10 packet    Refill:  5    Order Specific Question:   Supervising Provider    Answer:   Doristine Bosworth [id number]   Orders Placed This Encounter  Procedures  . CBC with Differential  . Lipid Panel  . Comprehensive metabolic panel  . Hemoglobin A1c    1. Encounter to establish care Pt wishes to establish myself as PCP. Does not currently have PCP  2. Lipid screening Class 1 obesity, will monitor  3. Essential hypertension Family history, both at home and in  office hypertensive readings Plan to start amlodipine 5mg  PO daily. 4 mo supply given, expect to make appt within 4 mos for BP check, med check, refills.  4. Class 1 obesity without serious comorbidity with body mass index (BMI) of 33.0 to 33.9 in adult, unspecified obesity type Briefly discussed weight management to assist in reflux and HTN symptoms  5. Constipation, unspecified constipation type Psyllium called to pharmacy, suggested patient use 10 days to reestablish routine bowel movements. Refills called if necessary. Pt encouraged to contact clinic with any questions, comments, or concerns.    Pt encouraged to contact clinic with any questions, comments, or concerns.    Thank you for your visit with Primary Care at [facility] today.  Janeece Agee, NP [facility] Primary Care at [facility] [address] [place], [place] [address] [phone] - 0000

## 2019-02-05 NOTE — Patient Instructions (Signed)
° ° ° °  If you have lab work done today you will be contacted with your lab results within the next 2 weeks.  If you have not heard from us then please contact us. The fastest way to get your results is to register for My Chart. ° ° °IF you received an x-ray today, you will receive an invoice from [facility]. Please contact [facility] at [phone] with questions or concerns regarding your invoice.  ° °IF you received labwork today, you will receive an invoice from [facility]. Please contact [facility] at [phone] with questions or concerns regarding your invoice.  ° °Our billing staff will not be able to assist you with questions regarding bills from these companies. ° °You will be contacted with the lab results as soon as they are available. The fastest way to get your results is to activate your My Chart account. Instructions are located on the last page of this paperwork. If you have not heard from us regarding the results in 2 weeks, please contact this office. °  ° ° ° °

## 2019-02-06 LAB — CBC WITH DIFFERENTIAL/PLATELET
Basophils Absolute: 0.1 10*3/uL (ref 0.0–0.2)
Basos: 1 %
EOS (ABSOLUTE): 0.1 10*3/uL (ref 0.0–0.4)
Eos: 1 %
Hematocrit: 43.2 % (ref 37.5–51.0)
Hemoglobin: 14.4 g/dL (ref 13.0–17.7)
Immature Grans (Abs): 0 10*3/uL (ref 0.0–0.1)
Immature Granulocytes: 0 %
Lymphocytes Absolute: 1.9 10*3/uL (ref 0.7–3.1)
Lymphs: 32 %
MCH: 27.6 pg (ref 26.6–33.0)
MCHC: 33.3 g/dL (ref 31.5–35.7)
MCV: 83 fL (ref 79–97)
Monocytes Absolute: 0.5 10*3/uL (ref 0.1–0.9)
Monocytes: 8 %
Neutrophils Absolute: 3.5 10*3/uL (ref 1.4–7.0)
Neutrophils: 58 %
Platelets: 291 10*3/uL (ref 150–450)
RBC: 5.22 x10E6/uL (ref 4.14–5.80)
RDW: 13.2 % (ref 11.6–15.4)
WBC: 6 10*3/uL (ref 3.4–10.8)

## 2019-02-06 LAB — LIPID PANEL
Chol/HDL Ratio: 4.5 ratio (ref 0.0–5.0)
Cholesterol, Total: 227 mg/dL — ABNORMAL HIGH (ref 100–199)
HDL: 50 mg/dL (ref >39)
LDL Calculated: 157 mg/dL — ABNORMAL HIGH (ref 0–99)
Triglycerides: 102 mg/dL (ref 0–149)
VLDL Cholesterol Cal: 20 mg/dL (ref 5–40)

## 2019-02-06 LAB — COMPREHENSIVE METABOLIC PANEL
ALT: 27 IU/L (ref 0–44)
AST: 40 IU/L (ref 0–40)
Albumin/Globulin Ratio: 2 (ref 1.2–2.2)
Albumin: 5.2 g/dL — ABNORMAL HIGH (ref 4.0–5.0)
Alkaline Phosphatase: 72 IU/L (ref 39–117)
BUN/Creatinine Ratio: 8 — ABNORMAL LOW (ref 9–20)
BUN: 12 mg/dL (ref 6–24)
Bilirubin Total: 0.6 mg/dL (ref 0.0–1.2)
CO2: 25 mmol/L (ref 20–29)
Calcium: 9.5 mg/dL (ref 8.7–10.2)
Chloride: 101 mmol/L (ref 96–106)
Creatinine, Ser: 1.6 mg/dL — ABNORMAL HIGH (ref 0.76–1.27)
GFR calc Af Amer: 60 mL/min/{1.73_m2} (ref >59)
GFR calc non Af Amer: 52 mL/min/{1.73_m2} — ABNORMAL LOW (ref >59)
Globulin, Total: 2.6 g/dL (ref 1.5–4.5)
Glucose: 102 mg/dL — ABNORMAL HIGH (ref 65–99)
Potassium: 4.2 mmol/L (ref 3.5–5.2)
Sodium: 140 mmol/L (ref 134–144)
Total Protein: 7.8 g/dL (ref 6.0–8.5)

## 2019-02-06 LAB — HEMOGLOBIN A1C
Est. average glucose Bld gHb Est-mCnc: 114 mg/dL
Hgb A1c MFr Bld: 5.6 % (ref 4.8–5.6)

## 2019-02-07 ENCOUNTER — Ambulatory Visit: Payer: 59 | Admitting: Emergency Medicine

## 2019-05-16 ENCOUNTER — Telehealth: Payer: Self-pay | Admitting: Registered Nurse

## 2019-05-16 ENCOUNTER — Other Ambulatory Visit: Payer: Self-pay

## 2019-05-16 DIAGNOSIS — I10 Essential (primary) hypertension: Secondary | ICD-10-CM

## 2019-05-16 MED ORDER — AMLODIPINE BESYLATE 5 MG PO TABS: 5.0000 mg | ORAL_TABLET | Freq: Every day | ORAL | 0 refills | Status: DC

## 2019-05-16 NOTE — Telephone Encounter (Signed)
P[t is out of amLODipine (NORVASC) 5 MG tablet . Please call into pharmacy on file prefers  [employer]

## 2019-05-22 ENCOUNTER — Other Ambulatory Visit: Payer: Self-pay

## 2019-05-22 ENCOUNTER — Telehealth (INDEPENDENT_AMBULATORY_CARE_PROVIDER_SITE_OTHER): Payer: 59 | Admitting: Registered Nurse

## 2019-05-22 DIAGNOSIS — I10 Essential (primary) hypertension: Secondary | ICD-10-CM | POA: Diagnosis not present

## 2019-05-22 MED ORDER — AMLODIPINE BESYLATE 5 MG PO TABS: 5.0000 mg | ORAL_TABLET | Freq: Every day | ORAL | 0 refills | Status: DC

## 2019-05-22 NOTE — Progress Notes (Signed)
Spoke with pt and he needs to follow-up for his B/P. Pt states he needs refills at this time. He states there was no other concerns at this time.

## 2019-05-22 NOTE — Progress Notes (Signed)
Telemedicine Encounter- SOAP NOTE Established Patient  This telephone encounter was conducted with the patient's (or proxy's) verbal consent via audio telecommunications: yes  Patient was instructed to have this encounter in a suitably private space; and to only have persons present to whom they give permission to participate. In addition, patient identity was confirmed by use of name plus two identifiers (DOB and address).  I discussed the limitations, risks, security and privacy concerns of performing an evaluation and management service by telephone and the availability of in person appointments. I also discussed with the patient that there may be a patient responsible charge related to this service. The patient expressed understanding and agreed to proceed.  I spent a total of 12 minutes talking with the patient or their proxy.  No chief complaint on file.   Subjective   Jason Ramos is a 43 y.o. established patient. Telephone visit today for BP med check  HPI Pt was seen 02/05/2019 for visit to establish care. BP at 148/93 at that visit. He was started on amlodipine 5mg  PO qd.  He reports that it has good effect. He does not check BP at home, but denies symptoms of HTN including headache, SHOB, chest pain, lower extremity swelling, changes to vision, DOE.  He is happy with amlodipine overall and hopes to continue. He reports his diet and activity level have improved.  Patient Active Problem List   Diagnosis Date Noted  . Essential hypertension 02/05/2019    Past Medical History:  Diagnosis Date  . Hypertension     Current Outpatient Medications  Medication Sig Dispense Refill  . amLODipine (NORVASC) 5 MG tablet Take 1 tablet (5 mg total) by mouth daily. 120 tablet 0   No current facility-administered medications for this visit.     No Known Allergies  Social History   Socioeconomic History  . Marital status: Single    Spouse name: Not on file  . Number of  children: Not on file  . Years of education: Not on file  . Highest education level: Not on file  Occupational History  . Not on file  Social Needs  . Financial resource strain: Not very hard  . Food insecurity    Worry: Never true    Inability: Never true  . Transportation needs    Medical: No    Non-medical: No  Tobacco Use  . Smoking status: Never Smoker  . Smokeless tobacco: Never Used  Substance and Sexual Activity  . Alcohol use: Yes    Comment: Socially  . Drug use: No  . Sexual activity: Yes  Lifestyle  . Physical activity    Days per week: Patient refused    Minutes per session: Patient refused  . Stress: Not at all  Relationships  . Social Herbalist on phone: Twice a week    Gets together: Twice a week    Attends religious service: Never    Active member of club or organization: No    Attends meetings of clubs or organizations: Never    Relationship status: Never married  . Intimate partner violence    Fear of current or ex partner: No    Emotionally abused: No    Physically abused: No    Forced sexual activity: No  Other Topics Concern  . Not on file  Social History Narrative  . Not on file    ROS Per hpi   Objective   Vitals as reported by the patient:  There were no vitals filed for this visit.  Diagnoses and all orders for this visit:  Essential hypertension -     amLODipine (NORVASC) 5 MG tablet; Take 1 tablet (5 mg total) by mouth daily.    PLAN  Refill amlodipine 5mg  PO qd  He will present for BP check within one week  If BP is elevated above 140/90, he will double dose of amlodipine to 10mg  PO qd and present again in 3 months for BP check.   If BP is wnl (below 140/90) he may continue on amlodipine 5mg  PO qd until May 2021, at which time we will plan to see him for CPE and labs.  Patient encouraged to call clinic with any questions, comments, or concerns.   I discussed the assessment and treatment plan with the  patient. The patient was provided an opportunity to ask questions and all were answered. The patient agreed with the plan and demonstrated an understanding of the instructions.   The patient was advised to call back or seek an in-person evaluation if the symptoms worsen or if the condition fails to improve as anticipated.  I provided 12 minutes of non-face-to-face time during this encounter.  Janeece Ageeichard Kalei Meda, NP  Primary Care at [facility]

## 2019-07-06 ENCOUNTER — Telehealth: Payer: Self-pay | Admitting: Registered Nurse

## 2019-07-06 NOTE — Telephone Encounter (Signed)
Pt is experiencing sob and vertigo with bp prescription. He mentioned he stopped taking the med and his bp does not go higher or lower when he does not take it. He feels that the med does not agree with his body and would like a change of medication or a cb with advice  Please advise

## 2019-07-09 ENCOUNTER — Ambulatory Visit (INDEPENDENT_AMBULATORY_CARE_PROVIDER_SITE_OTHER): Payer: 59 | Admitting: Registered Nurse

## 2019-07-09 ENCOUNTER — Encounter: Payer: Self-pay | Admitting: Registered Nurse

## 2019-07-09 ENCOUNTER — Other Ambulatory Visit: Payer: Self-pay

## 2019-07-09 VITALS — BP 138/76 | HR 96 | Temp 99.0°F | Ht 75.0 in | Wt 281.2 lb

## 2019-07-09 DIAGNOSIS — I10 Essential (primary) hypertension: Secondary | ICD-10-CM

## 2019-07-09 MED ORDER — AMLODIPINE BESYLATE 10 MG PO TABS: 10.0000 mg | ORAL_TABLET | Freq: Every day | ORAL | 1 refills | Status: DC

## 2019-07-09 NOTE — Progress Notes (Signed)
Established Patient Office Visit  Subjective:  Patient ID: Jason Ramos, male    DOB: Oct 17, 1975  Age: 43 y.o. MRN: [id number]  CC:  Chief Complaint  Patient presents with  . Headache    for week (been taking 1 tab BID for the last 4 days ) body doesnt feel the same   . Hypertension    flucuations     HPI Jason Ramos presents for concern for HTN. Reports home BP has been wnl but elevated at times to 160/100. He states that he has had intermittent headache for 4 days. He has been taking 5mg  amlodipine PO qd for the past week, though he had previously been taking 10mg  PO qd. Denies chest pain, visual changes, DOE, weakness, dependent edema.  States that between his last visit and today, he has lost his job and his insurance. 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 This has all brought on diet changes, stress, and limited his sleep.  Past Medical History:  Diagnosis Date  . Hypertension     No past surgical history on file.  Family History  Problem Relation Age of Onset  . Hypertension Mother   . Hypertension Father   . Hypertension Sister     Social History   Socioeconomic History  . Marital status: Single    Spouse name: Not on file  . Number of children: Not on file  . Years of education: Not on file  . Highest education level: Not on file  Occupational History  . Not on file  Social Needs  . Financial resource strain: Not very hard  . Food insecurity    Worry: Never true    Inability: Never true  . Transportation needs    Medical: No    Non-medical: No  Tobacco Use  . Smoking status: Never Smoker  . Smokeless tobacco: Never Used  Substance and Sexual Activity  . Alcohol use: Yes    Comment: Socially  . Drug use: No  . Sexual activity: Yes  Lifestyle  . Physical activity    Days per week: Patient refused    Minutes per session: Patient refused  . Stress: Not at  all  Relationships  . Social Herbalist on phone: Twice a week    Gets together: Twice a week    Attends religious service: Never    Active member of club or organization: No    Attends meetings of clubs or organizations: Never    Relationship status: Never married  . Intimate partner violence    Fear of current or ex partner: No    Emotionally abused: No    Physically abused: No    Forced sexual activity: No  Other Topics Concern  . Not on file  Social History Narrative  . Not on file    Outpatient Medications Prior to Visit  Medication Sig Dispense Refill  . amLODipine (NORVASC) 5 MG tablet Take 1 tablet (5 mg total) by mouth daily. 120 tablet 0   No facility-administered medications prior to visit.     No Known Allergies  ROS Review of Systems Per hpi   Objective:    Physical Exam  Constitutional: He is oriented to person, place, and time. He appears well-developed and well-nourished. No distress.  Cardiovascular: Normal rate, regular rhythm and normal heart sounds.  Pulmonary/Chest: Effort normal. No respiratory distress.  Musculoskeletal:  General: No edema.  Neurological: He is alert and oriented to person, place, and time.  Skin: Skin is warm and dry. No rash noted. He is not diaphoretic. No erythema. No pallor.  Psychiatric: He has a normal mood and affect. His behavior is normal. Judgment and thought content normal.  Nursing note and vitals reviewed.   BP 138/76   Pulse 96   Temp 99 F (37.2 C) (Oral)   Ht 6\' 3"  (1.905 m)   Wt 281 lb 3.2 oz (127.6 kg)   SpO2 98%   BMI 35.15 kg/m  Wt Readings from Last 3 Encounters:  07/09/19 281 lb 3.2 oz (127.6 kg)  02/05/19 273 lb (123.8 kg)  04/21/16 280 lb (127 kg)     Health Maintenance Due  Topic Date Due  . HIV Screening  04/15/1989  . INFLUENZA VACCINE  05/05/2019    There are no preventive care reminders to display for this patient.  No results found for: TSH Lab Results   Component Value Date   WBC 6.0 02/05/2019   HGB 14.4 02/05/2019   HCT 43.2 02/05/2019   MCV 83 02/05/2019   PLT 291 02/05/2019   Lab Results  Component Value Date   NA 140 02/05/2019   K 4.2 02/05/2019   CO2 25 02/05/2019   GLUCOSE 102 (H) 02/05/2019   BUN 12 02/05/2019   CREATININE 1.60 (H) 02/05/2019   BILITOT 0.6 02/05/2019   ALKPHOS 72 02/05/2019   AST 40 02/05/2019   ALT 27 02/05/2019   PROT 7.8 02/05/2019   ALBUMIN 5.2 (H) 02/05/2019   CALCIUM 9.5 02/05/2019   Lab Results  Component Value Date   CHOL 227 (H) 02/05/2019   Lab Results  Component Value Date   HDL 50 02/05/2019   Lab Results  Component Value Date   LDLCALC 157 (H) 02/05/2019   Lab Results  Component Value Date   TRIG 102 02/05/2019   Lab Results  Component Value Date   CHOLHDL 4.5 02/05/2019   Lab Results  Component Value Date   HGBA1C 5.6 02/05/2019      Assessment & Plan:   Problem List Items Addressed This Visit      Cardiovascular and Mediastinum   Essential hypertension - Primary   Relevant Medications   amLODipine (NORVASC) 10 MG tablet      Meds ordered this encounter  Medications  . amLODipine (NORVASC) 10 MG tablet    Sig: Take 1 tablet (10 mg total) by mouth daily.    Dispense:  90 tablet    Refill:  1    Order Specific Question:   Supervising Provider    Answer:   04/07/2019 Doristine Bosworth    Follow-up: No follow-ups on file.   PLAN  Discussed that his symptoms are more consistent with tension type headache than a cardiac etiology.   Discussed dietary changes and lifestyle modifications for heart health, including limiting sodium, red meat, and fried food intake, as well as mild to moderate exercise 30 minutes each day for 5 days each week.   Amlodipine 10mg  PO qd for HTN, 6 mo supply sent to pharmacy, will follow up at that point, or sooner if there are any complications   Patient encouraged to call clinic with any questions, comments, or concerns.    [id number], NP

## 2019-07-09 NOTE — Patient Instructions (Signed)
° ° ° °  If you have lab work done today you will be contacted with your lab results within the next 2 weeks.  If you have not heard from us then please contact us. The fastest way to get your results is to register for My Chart. ° ° °IF you received an x-ray today, you will receive an invoice from [facility]. Please contact [facility] at [phone] with questions or concerns regarding your invoice.  ° °IF you received labwork today, you will receive an invoice from [facility]. Please contact [facility] at [phone] with questions or concerns regarding your invoice.  ° °Our billing staff will not be able to assist you with questions regarding bills from these companies. ° °You will be contacted with the lab results as soon as they are available. The fastest way to get your results is to activate your My Chart account. Instructions are located on the last page of this paperwork. If you have not heard from us regarding the results in 2 weeks, please contact this office. °  ° ° ° °

## 2020-01-07 ENCOUNTER — Ambulatory Visit: Payer: 59 | Admitting: Registered Nurse

## 2020-01-08 ENCOUNTER — Encounter: Payer: Self-pay | Admitting: Registered Nurse

## 2020-08-18 ENCOUNTER — Telehealth: Payer: Self-pay | Admitting: Registered Nurse

## 2020-08-18 DIAGNOSIS — I10 Essential (primary) hypertension: Secondary | ICD-10-CM

## 2020-08-18 NOTE — Telephone Encounter (Signed)
Last OV on 07/09/2019 , pt needs an appt. In order to send in refill to cover him until his appt.

## 2020-08-18 NOTE — Telephone Encounter (Signed)
Requested medication (s) are due for refill today:  Yes  Requested medication (s) are on the active medication list:  Yes  Future visit scheduled:  No  Last Refill: 07/09/19; #90; RF x 1  Notes to clinic:  Attempted to contact pt. To schedule f/u appt. (last seen in 07/2019)  No answer; no option to leave a vm.  Requested Prescriptions  Pending Prescriptions Disp Refills   amLODipine (NORVASC) 10 MG tablet [Pharmacy Med Name: amLODIPine Besylate 10 MG Oral Tablet] 90 tablet 0    Sig: Take 1 tablet by mouth once daily      Cardiovascular:  Calcium Channel Blockers Failed - 08/18/2020  8:48 AM      Failed - Valid encounter within last 6 months    Recent Outpatient Visits           1 year ago Essential hypertension   [facility], Gerlene Burdock, NP   1 year ago Essential hypertension   [facility], Gerlene Burdock, NP   1 year ago Encounter to establish care   [facility], Gerlene Burdock, NP              Passed - Last BP in normal range    BP Readings from Last 1 Encounters:  07/09/19 138/76

## 2020-08-18 NOTE — Telephone Encounter (Signed)
Pt called regarding a med refill on  amLODipine (NORVASC) 10 MG tablet [[id number]]  Pt stated he contacted pharmacy and that they are just waiting for doctor to approve the refill.Pt would like a call when this is sent in to the pharmacy. Please advise.  [facility] [address] [place] - [address]

## 2020-08-18 NOTE — Telephone Encounter (Signed)
Called pt and scheduled appt for 08/26/20 pt only has 6 pills left so he will need that curtesy refill.

## 2020-08-19 ENCOUNTER — Other Ambulatory Visit: Payer: Self-pay

## 2020-08-19 DIAGNOSIS — I10 Essential (primary) hypertension: Secondary | ICD-10-CM

## 2020-08-19 MED ORDER — AMLODIPINE BESYLATE 10 MG PO TABS: 10.0000 mg | ORAL_TABLET | Freq: Every day | ORAL | 1 refills | Status: AC

## 2020-08-19 NOTE — Telephone Encounter (Signed)
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  Please advise

## 2020-08-19 NOTE — Telephone Encounter (Signed)
Called the patient to let him know that the medication was sent to the pharmacy.

## 2020-08-26 ENCOUNTER — Ambulatory Visit: Payer: 59 | Admitting: Registered Nurse

## 2020-12-29 ENCOUNTER — Encounter (HOSPITAL_COMMUNITY): Payer: Self-pay

## 2020-12-29 ENCOUNTER — Ambulatory Visit (HOSPITAL_COMMUNITY)
Admission: EM | Admit: 2020-12-29 | Discharge: 2020-12-29 | Disposition: A | Discharge status: Home / Self Care | Payer: Self-pay | Attending: Urgent Care | Admitting: Urgent Care

## 2020-12-29 ENCOUNTER — Other Ambulatory Visit: Payer: Self-pay

## 2020-12-29 DIAGNOSIS — L42 Pityriasis rosea: Secondary | ICD-10-CM

## 2020-12-29 DIAGNOSIS — I10 Essential (primary) hypertension: Secondary | ICD-10-CM

## 2020-12-29 DIAGNOSIS — L299 Pruritus, unspecified: Secondary | ICD-10-CM

## 2020-12-29 DIAGNOSIS — R030 Elevated blood-pressure reading, without diagnosis of hypertension: Secondary | ICD-10-CM

## 2020-12-29 MED ORDER — HYDROXYZINE HCL 25 MG PO TABS: 12.5000 mg | ORAL_TABLET | Freq: Three times a day (TID) | ORAL | 1 refills | Status: AC | PRN

## 2020-12-29 MED ORDER — HYDROXYZINE HCL 25 MG PO TABS: 12.5000 mg | ORAL_TABLET | Freq: Three times a day (TID) | ORAL | 1 refills | Status: DC | PRN

## 2020-12-29 NOTE — Discharge Instructions (Addendum)

## 2020-12-29 NOTE — ED Provider Notes (Signed)
[facility]   MRN: [id number] DOB: Oct 18, 1975  Subjective:   Jason Ramos is a 45 y.o. male presenting for 1 week history of persistent rash over his torso, arms and legs.  Patient states that he started out with a few spots over his chest and back and has since spread diffusely to the rest of the areas.  Denies fever, headache, confusion, weakness, numbness or tingling, cough, sore throat, chest pain, shortness of breath.  Patient has mostly had itching.  He went to the pharmacy and has been using Benadryl and hydrocortisone cream.  Wanted to make sure he got evaluated and checked.  Regarding his blood pressure, states that he is on amlodipine but forgot to take this morning.  No current facility-administered medications for this encounter.  Current Outpatient Medications:  .  amLODipine (NORVASC) 10 MG tablet, Take 1 tablet (10 mg total) by mouth daily., Disp: 90 tablet, Rfl: 1 .  hydrOXYzine (ATARAX/VISTARIL) 25 MG tablet, Take 0.5-1 tablets (12.5-25 mg total) by mouth every 8 (eight) hours as needed for itching., Disp: 30 tablet, Rfl: 1   No Known Allergies  Past Medical History:  Diagnosis Date  . Hypertension      History reviewed. No pertinent surgical history.  Family History  Problem Relation Age of Onset  . Hypertension Mother   . Hypertension Father   . Hypertension Sister     Social History   Tobacco Use  . Smoking status: Never Smoker  . Smokeless tobacco: Never Used  Vaping Use  . Vaping Use: Never used  Substance Use Topics  . Alcohol use: Yes    Comment: Socially  . Drug use: No    ROS   Objective:   Vitals: BP (!) 180/108 (BP Location: Left Arm)   Pulse 92   Temp 98.4 F (36.9 C) (Oral)   Resp 18   SpO2 97%   BP recheck was 153/107.  Physical Exam Constitutional:      General: He is not in acute distress.    Appearance: Normal appearance. He is well-developed. He is not ill-appearing, toxic-appearing or diaphoretic.   HENT:     Head: Normocephalic and atraumatic.     Right Ear: External ear normal.     Left Ear: External ear normal.     Nose: Nose normal.     Mouth/Throat:     Mouth: Mucous membranes are moist.     Pharynx: Oropharynx is clear.  Eyes:     General: No scleral icterus.    Extraocular Movements: Extraocular movements intact.     Pupils: Pupils are equal, round, and reactive to light.  Cardiovascular:     Rate and Rhythm: Normal rate and regular rhythm.     Heart sounds: Normal heart sounds. No murmur heard. No friction rub. No gallop.   Pulmonary:     Effort: Pulmonary effort is normal. No respiratory distress.     Breath sounds: Normal breath sounds. No stridor. No wheezing, rhonchi or rales.  Skin:    General: Skin is warm and dry.     Findings: Rash (Diffusely spread lesions of various sizes annular oval-shaped, refer to images) present.  Neurological:     Mental Status: He is alert and oriented to person, place, and time.     Cranial Nerves: No cranial nerve deficit.     Motor: No weakness.     Coordination: Coordination normal.     Gait: Gait normal.     Deep Tendon  Reflexes: Reflexes normal.  Psychiatric:        Mood and Affect: Mood normal.        Behavior: Behavior normal.        Thought Content: Thought content normal.        Judgment: Judgment normal.         Assessment and Plan :   PDMP not reviewed this encounter.  1. Pityriasis rosea   2. Itching   3. Essential hypertension   4. Elevated blood pressure reading     Anticipatory guidance provided for pityriasis rosea.  Recommended hydroxyzine, supportive care.  Regarding his blood pressure, reviewed need for significant dietary modifications.  Also encouraged patient to have medical compliance.  Follow-up with PCP ASAP for recheck.  No signs of acute process related to his blood pressure abdomen. Counseled patient on potential for adverse effects with medications prescribed/recommended today, ER and  return-to-clinic precautions discussed, patient verbalized understanding.    Wallis Bamberg, [place] 12/29/20 1222

## 2020-12-29 NOTE — ED Triage Notes (Signed)
Pt presents with rash on chest, stomach, and back that started last week from unknown source.
